# Patient Record
Sex: Male | Born: 1940 | Race: White | Hispanic: No | Marital: Married | Smoking: Former smoker
Health system: Southern US, Community
[De-identification: ages and names within clinical notes are randomized; demographics above are authoritative.]

## PROBLEM LIST (undated history)

## (undated) DIAGNOSIS — C140 Malignant neoplasm of pharynx, unspecified: Secondary | ICD-10-CM

## (undated) DIAGNOSIS — C679 Malignant neoplasm of bladder, unspecified: Secondary | ICD-10-CM

## (undated) DIAGNOSIS — I10 Essential (primary) hypertension: Secondary | ICD-10-CM

## (undated) HISTORY — DX: Malignant neoplasm of bladder, unspecified: C67.9

## (undated) HISTORY — PX: CHOLECYSTECTOMY: SHX55

## (undated) HISTORY — DX: Malignant neoplasm of pharynx, unspecified: C14.0

## (undated) HISTORY — DX: Essential (primary) hypertension: I10

---

## 2014-02-11 ENCOUNTER — Institutional Professional Consult (permissible substitution): Payer: Self-pay | Admitting: Internal Medicine

## 2014-02-14 ENCOUNTER — Ambulatory Visit (INDEPENDENT_AMBULATORY_CARE_PROVIDER_SITE_OTHER): Payer: Medicare Other | Admitting: Internal Medicine

## 2014-02-14 ENCOUNTER — Encounter: Payer: Self-pay | Admitting: Internal Medicine

## 2014-02-14 ENCOUNTER — Ambulatory Visit (INDEPENDENT_AMBULATORY_CARE_PROVIDER_SITE_OTHER)
Admission: RE | Admit: 2014-02-14 | Discharge: 2014-02-14 | Disposition: A | Discharge status: Home / Self Care | Payer: Medicare Other | Source: Ambulatory Visit | Attending: Internal Medicine | Admitting: Internal Medicine

## 2014-02-14 ENCOUNTER — Encounter: Payer: Self-pay | Admitting: *Deleted

## 2014-02-14 VITALS — BP 118/60 | HR 83 | Ht 69.0 in | Wt 146.0 lb

## 2014-02-14 DIAGNOSIS — J449 Chronic obstructive pulmonary disease, unspecified: Secondary | ICD-10-CM

## 2014-02-14 NOTE — Progress Notes (Signed)
   Subjective:    Patient ID: Carl Livingston, male    DOB: [birth year],    MRN: [id number]  HPI  1 yowm quit smoking 2011 with throat cancer RT in [facility] 2014 then shingles Aug 13 2013 and R shoulder pain about the same time needing R Shouder sugery and clearance for rotator cuff repair  Requested by Carl Livingston/ Para March so first seen in  pulmonary clinic 02/14/2014    02/14/2014 1st [facility] office visit/ Carl Livingston   Chief Complaint  Patient presents with  . Pulmonary Consult    Referred by Carl. Raliegh Livingston for pulmonary clearance for rotator cuff repair. Pt currently denies any respiratory co's.   very sedentary has riding mower only at baseline, not even doing long walks  No am cough / congestion   No obvious other patterns in day to day or daytime variabilty or assoc chronic cough or cp or chest tightness, subjective wheeze overt sinus or hb symptoms. No unusual exp hx or h/o childhood pna/ asthma or knowledge of premature birth.  Sleeping ok without nocturnal  or early am exacerbation  of respiratory  c/o's or need for noct saba. Also denies any obvious fluctuation of symptoms with weather or environmental changes or other aggravating or alleviating factors except as outlined above   Current Medications, Allergies, Complete Past Medical History, Past Surgical History, Family History, and Social History were reviewed in [employer] record.              Review of Systems  Constitutional: Negative for fever, chills, activity change, appetite change and unexpected weight change.  HENT: Positive for sneezing. Negative for congestion, dental problem, postnasal drip, rhinorrhea, sore throat, trouble swallowing and voice change.   Eyes: Negative for visual disturbance.  Respiratory: Negative for cough, choking and shortness of breath.   Cardiovascular: Negative for chest pain and leg swelling.  Gastrointestinal: Negative for nausea, vomiting and abdominal pain.   Genitourinary: Negative for difficulty urinating.       Indigestion  Musculoskeletal: Negative for arthralgias.  Skin: Negative for rash.  Psychiatric/Behavioral: Negative for behavioral problems and confusion.       Objective:   Physical Exam  amb wm nad   Wt Readings from Last 3 Encounters:  02/14/14 146 lb (66.225 kg)    Vital signs reviewed   HEENT: nl dentition, turbinates, and orophanx. Nl external ear canals without cough reflex   NECK :  without JVD/Nodes/TM/ nl carotid upstrokes bilaterally   LUNGS: no acc muscle use, slt barrel chest, distant bs, mild increaesed exp time, no wheeze    CV:  RRR  no s3 or murmur or increase in P2, no edema   ABD:  soft and nontender with nl excursion in the supine position. 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 No acute cardiopulmonary disease     Assessment & Plan:

## 2014-02-14 NOTE — Progress Notes (Signed)
Quick Note:  Number d/ced Will send letter ______

## 2014-02-14 NOTE — Patient Instructions (Signed)
You are cleared for shoulder surgery  No pulmonary follow up is needed

## 2014-02-15 NOTE — Assessment & Plan Note (Signed)
-  Quit smoking 2011 -Spirometry 02/14/2014 >  FEV1   1.54 (48%) ratio 60   He has a classic emphysemic pattern copd with no sign bronchitic or asthmatic component and only mild/mod obst  As I explained to this patient in detail:  although there may be copd present, it may not be clinically relevant:   it does not appear to be limiting activity tolerance any more than a set of worn tires limits someone from driving a car  around a parking lot.  A new set of Michelins might look good but would have no perceived impact on the performance of the car and would not be worth the cost.  That is to say:   this pt is so sedentary I don't recommend aggressive pulmonary rx at this point unless limiting symptoms arise or acute exacerbations become as issue, neither of which is the case now.  I asked the patient to contact this office at any time in the future should either of these problems arise.    In meantime he is cleared for R shoulder sugery

## 2015-04-12 DEATH — deceased

## 2016-06-29 IMAGING — CR DG CHEST 2V
2 series · 2 of 2 positions shown · non-contrast
Comparison: None.

CLINICAL DATA: Preoperative respiratory evaluation prior to
unspecified right shoulder surgery. Former smoker with current
history of COPD.

EXAM:
CHEST  2 VIEW

[view not recorded (1 of 2)]
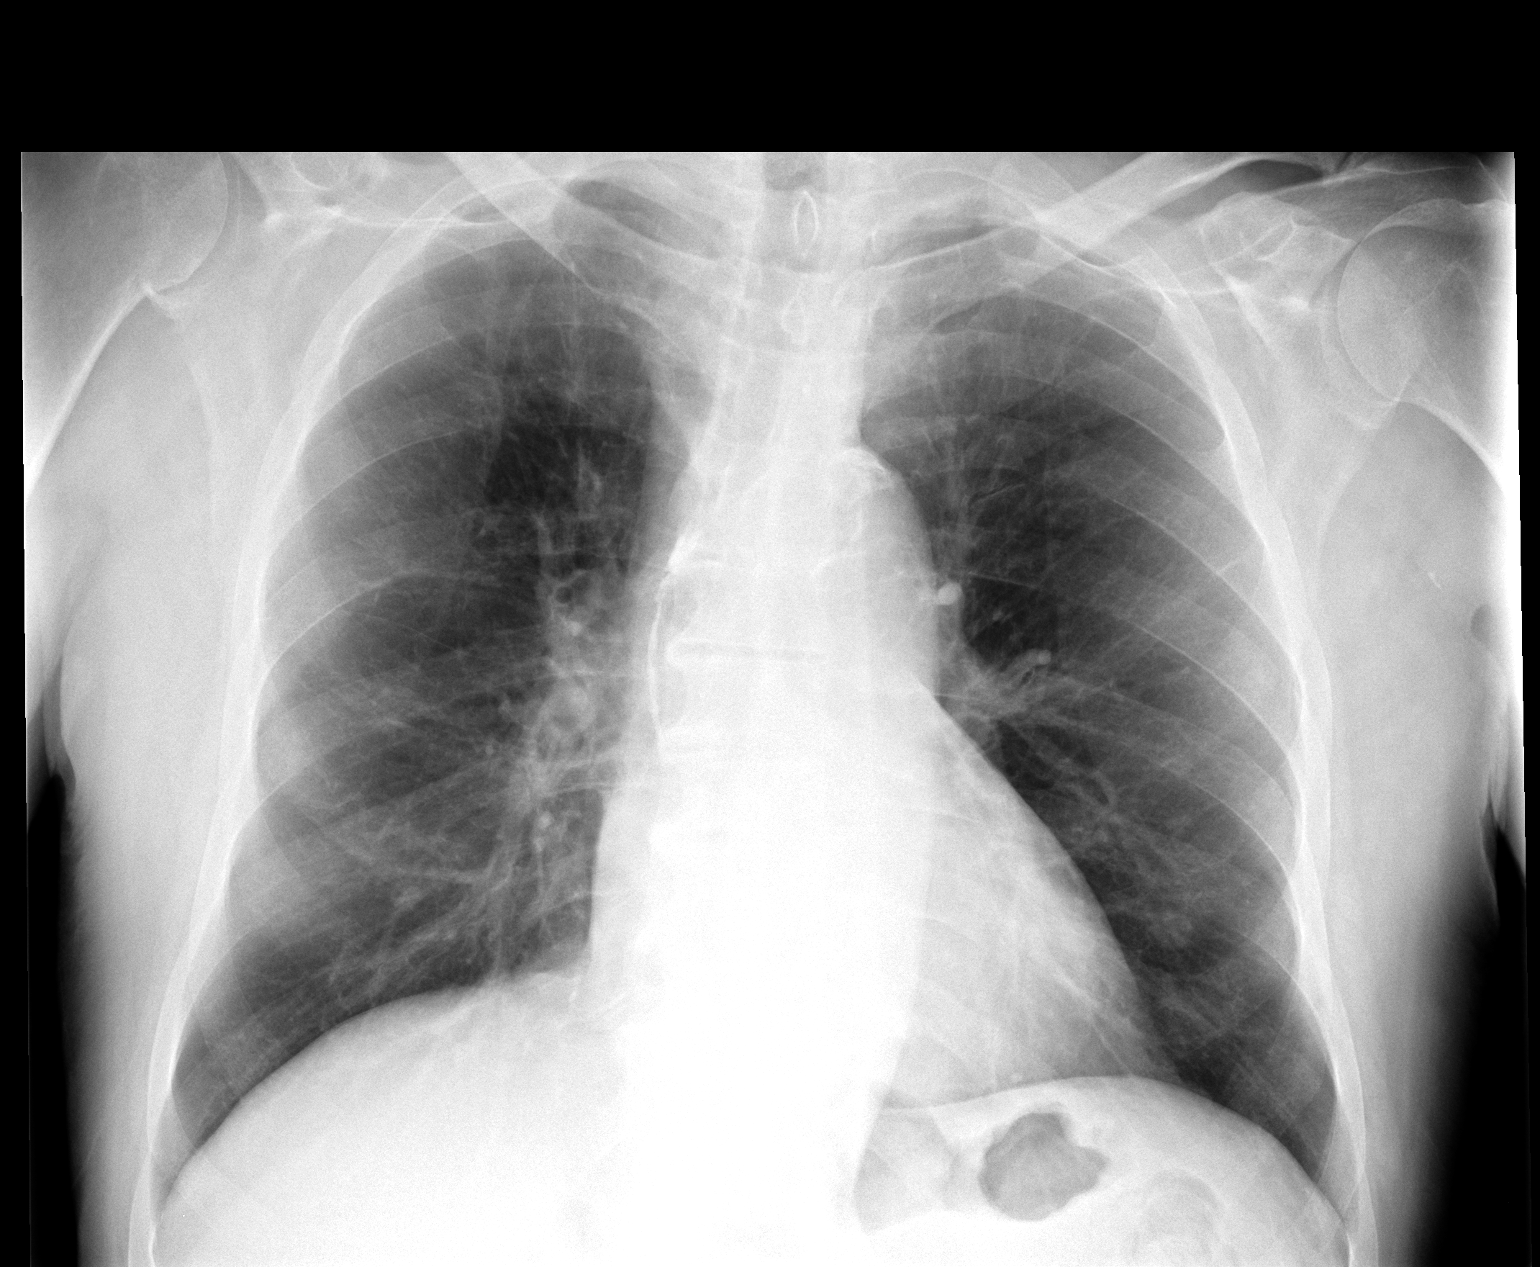

[view not recorded (2 of 2)]
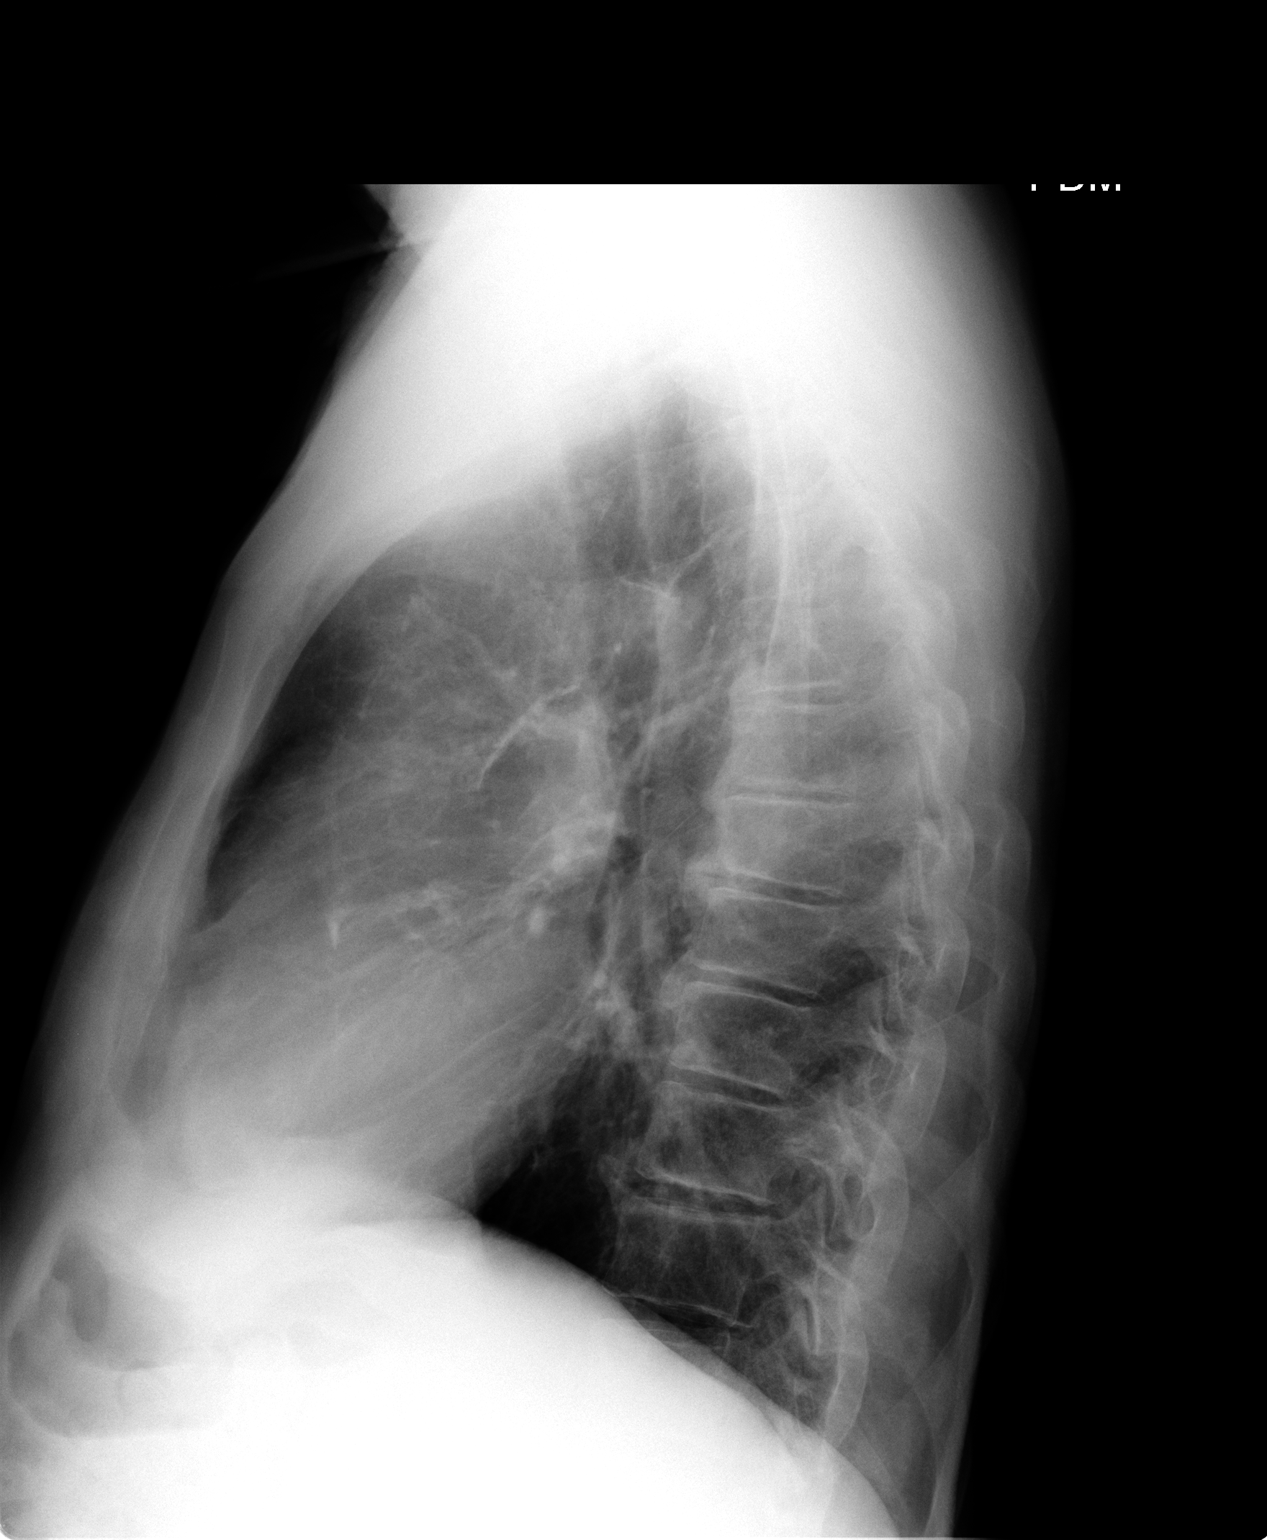

[2 of 2 positions shown; findings below may reference images not displayed]

FINDINGS: Cardiac silhouette normal in size. Thoracic aorta atherosclerotic.
Hilar and mediastinal contours otherwise unremarkable. Emphysematous
changes in both lungs. Lungs clear. Bronchovascular markings normal.
Pulmonary vascularity normal. No visible pleural effusions. No
pneumothorax. Degenerative changes involving the thoracic spine and
both acromioclavicular joints.
IMPRESSION: COPD/emphysema.  No acute cardiopulmonary disease.
